# Patient Record
Sex: Female | Born: 1935 | Race: White | Hispanic: No
Health system: Southern US, Community
[De-identification: ages and names within clinical notes are randomized; demographics above are authoritative.]

---

## 1998-05-21 ENCOUNTER — Encounter: Admission: RE | Admit: 1998-05-21 | Discharge: 1998-05-22 | Payer: Self-pay | Admitting: Internal Medicine

## 1998-06-02 ENCOUNTER — Encounter: Payer: Self-pay | Admitting: Internal Medicine

## 1998-06-02 ENCOUNTER — Emergency Department (HOSPITAL_COMMUNITY): Admission: EM | Admit: 1998-06-02 | Discharge: 1998-06-02 | Payer: Self-pay | Admitting: Internal Medicine

## 1998-07-20 ENCOUNTER — Emergency Department (HOSPITAL_COMMUNITY): Admission: EM | Admit: 1998-07-20 | Discharge: 1998-07-20 | Payer: Self-pay | Admitting: Emergency Medicine

## 1998-07-23 ENCOUNTER — Ambulatory Visit: Admission: RE | Admit: 1998-07-23 | Discharge: 1998-07-23 | Payer: Self-pay | Admitting: Internal Medicine

## 1999-01-04 ENCOUNTER — Emergency Department (HOSPITAL_COMMUNITY): Admission: EM | Admit: 1999-01-04 | Discharge: 1999-01-04 | Payer: Self-pay | Admitting: Emergency Medicine

## 1999-01-04 ENCOUNTER — Encounter: Payer: Self-pay | Admitting: Emergency Medicine

## 1999-03-21 ENCOUNTER — Emergency Department (HOSPITAL_COMMUNITY): Admission: EM | Admit: 1999-03-21 | Discharge: 1999-03-21 | Payer: Self-pay

## 2000-08-25 ENCOUNTER — Emergency Department (HOSPITAL_COMMUNITY): Admission: EM | Admit: 2000-08-25 | Discharge: 2000-08-25 | Payer: Self-pay | Admitting: Emergency Medicine

## 2000-10-07 ENCOUNTER — Emergency Department (HOSPITAL_COMMUNITY): Admission: EM | Admit: 2000-10-07 | Discharge: 2000-10-07 | Payer: Self-pay | Admitting: Emergency Medicine

## 2002-04-24 ENCOUNTER — Encounter: Payer: Self-pay | Admitting: Emergency Medicine

## 2002-04-24 ENCOUNTER — Emergency Department (HOSPITAL_COMMUNITY): Admission: EM | Admit: 2002-04-24 | Discharge: 2002-04-24 | Payer: Self-pay | Admitting: Emergency Medicine

## 2002-11-14 ENCOUNTER — Inpatient Hospital Stay (HOSPITAL_COMMUNITY): Admission: EM | Admit: 2002-11-14 | Discharge: 2002-11-16 | Payer: Self-pay | Admitting: Emergency Medicine

## 2002-11-18 ENCOUNTER — Emergency Department (HOSPITAL_COMMUNITY): Admission: EM | Admit: 2002-11-18 | Discharge: 2002-11-18 | Payer: Self-pay | Admitting: Emergency Medicine

## 2002-12-01 ENCOUNTER — Inpatient Hospital Stay (HOSPITAL_COMMUNITY): Admission: EM | Admit: 2002-12-01 | Discharge: 2002-12-04 | Payer: Self-pay | Admitting: Emergency Medicine

## 2004-02-22 IMAGING — CT CT HEAD W/O CM
1 of 2 series · 13 of 30 positions shown, 17 images · non-contrast
Comparison: none

CLINICAL DATA: Fell striking forehead.  Laceration.

[Series 3: — · axial · 0.43mm/px · z∈[-173,-53]mm · 13 of 30 slices shown, 17 images]
[im 3/30  brain]
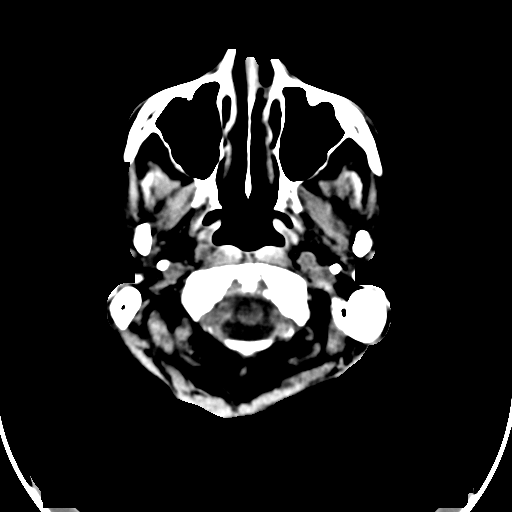
[im 3/30  bone]
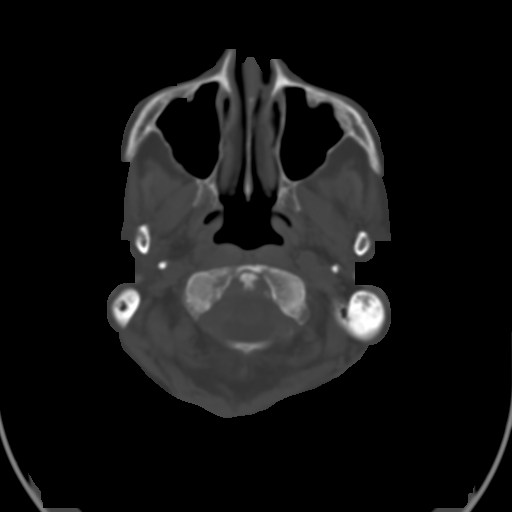
[im 5/30  brain]
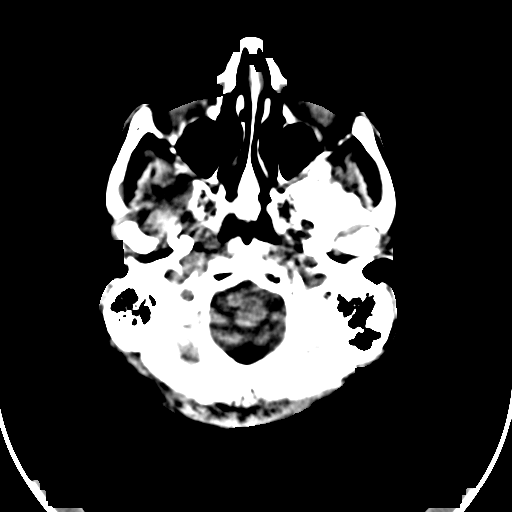
[im 7/30  brain]
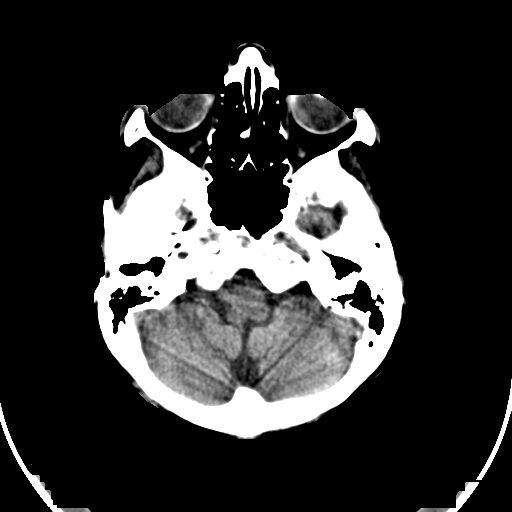
[im 9/30  brain]
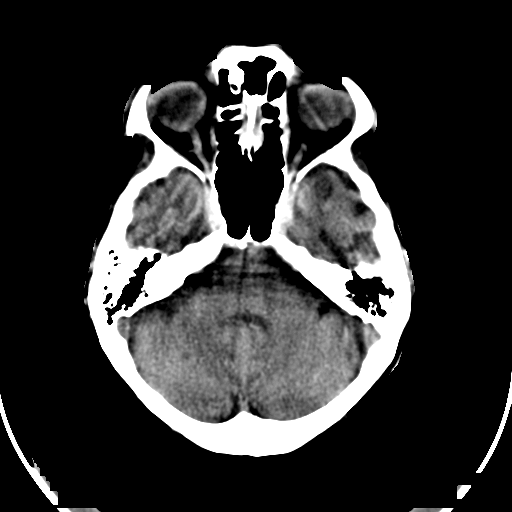
[im 11/30  brain]
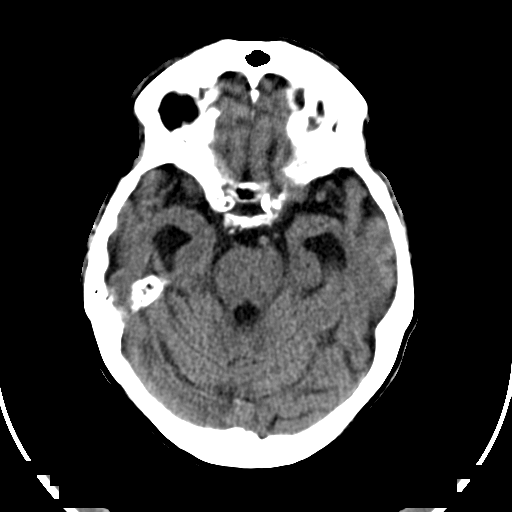
[im 11/30  bone]
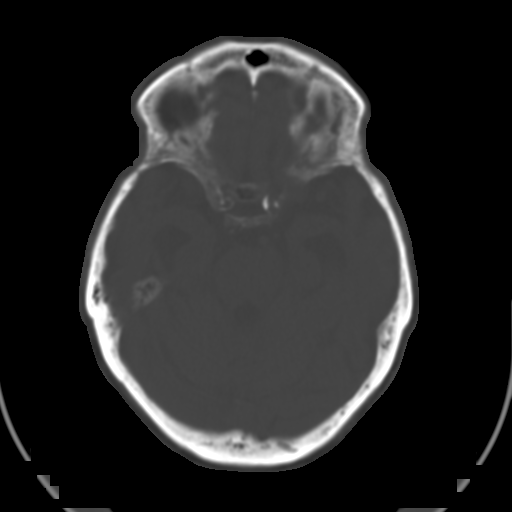
[im 13/30  brain]
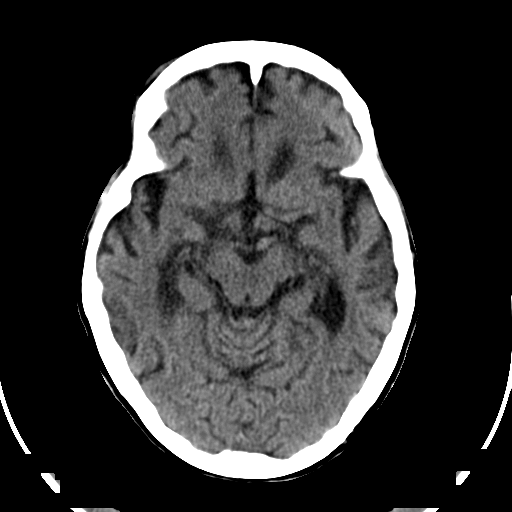
[im 15/30  brain]
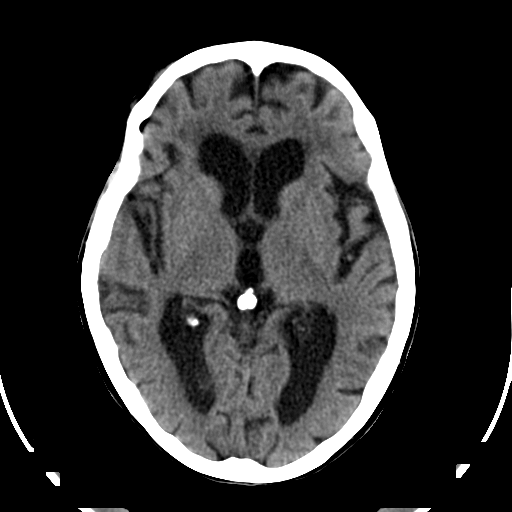
[im 17/30  brain]
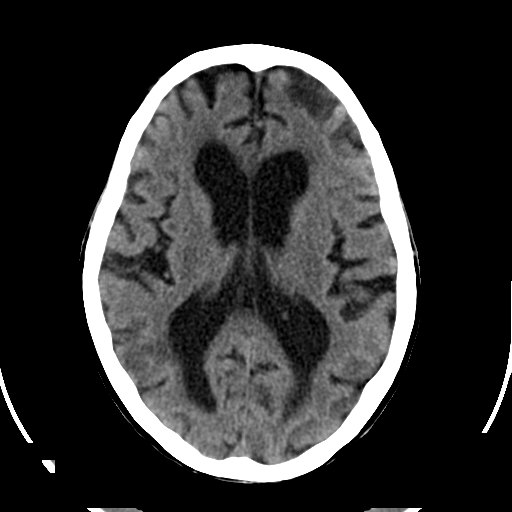
[im 19/30  brain]
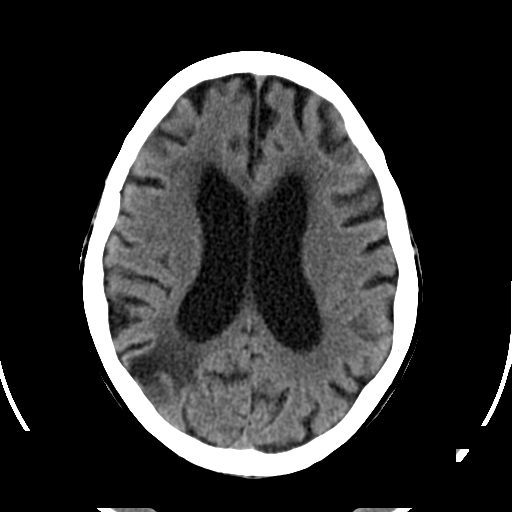
[im 19/30  bone]
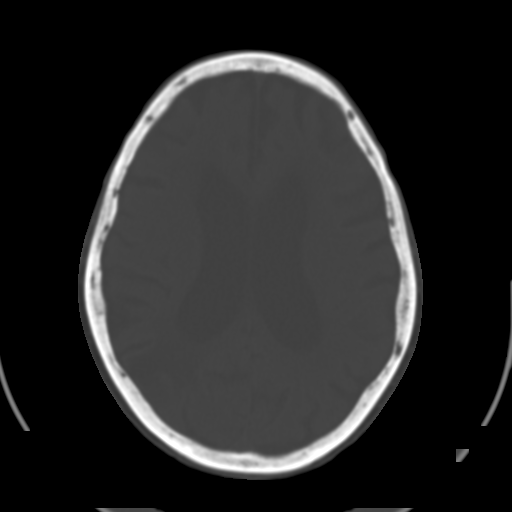
[im 21/30  brain]
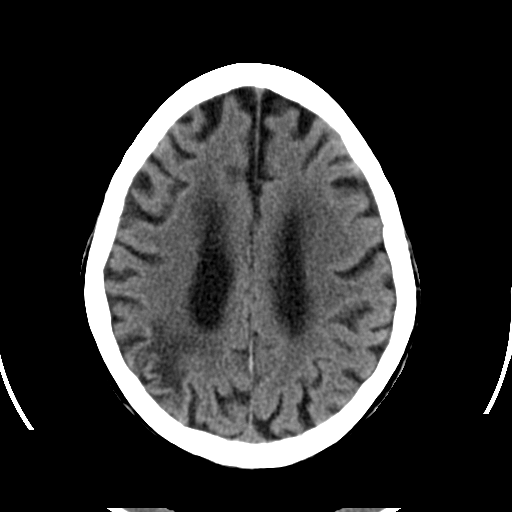
[im 23/30  brain]
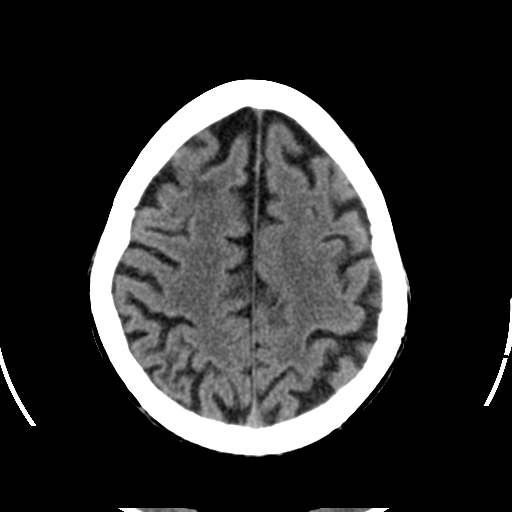
[im 25/30  brain]
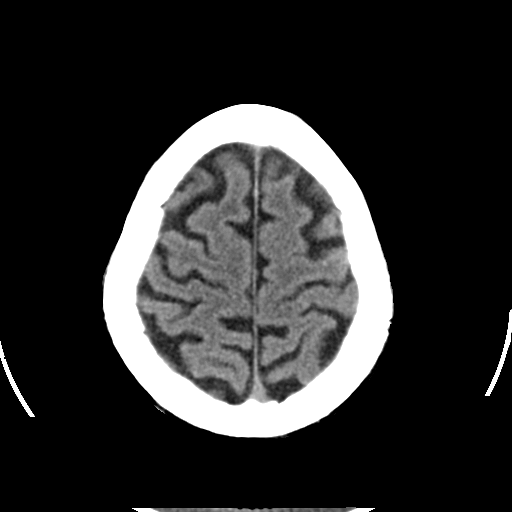
[im 27/30  brain]
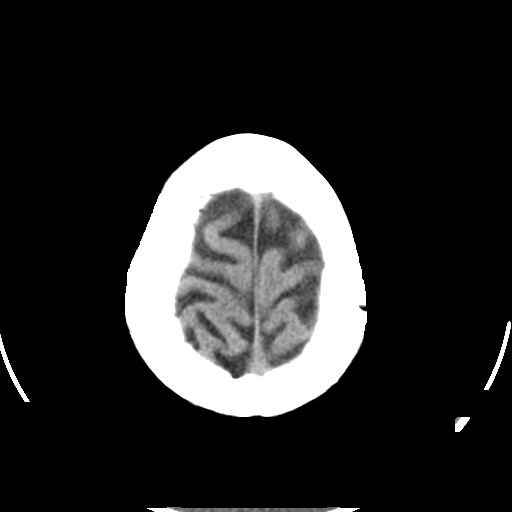
[im 27/30  bone]
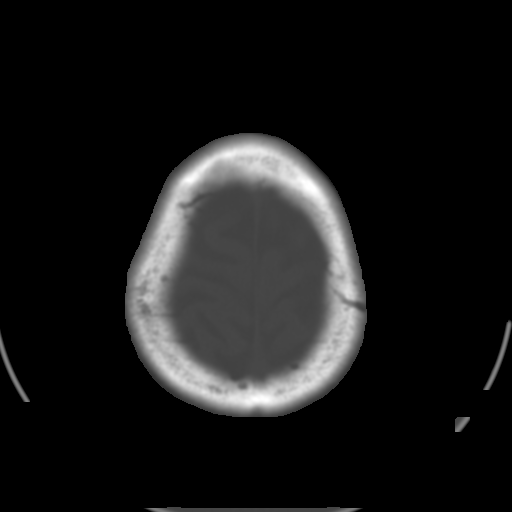

[13 of 30 positions shown; findings below may reference images not displayed]

CT HEAD WITHOUT CONTRAST
 Routine noncontrast CT head compared to 04/24/02.

 Generalized atrophy.  Mild small vessel chronic ischemic changes of the cerebral white matter.  Old right posterior parietal infarct.  No hydrocephalus, midline shift, or mass effect.  No acute hemorrhage, infarct or mass.  Sinuses are clear.  Bones are unremarkable.

 IMPRESSION
 Atrophy with small vessel chronic ischemic changes of the deep cerebral white matter.

 Old right parietal infarct.

 No acute intracranial abnormalities.

## 2021-01-06 ENCOUNTER — Telehealth: Payer: Self-pay | Admitting: Nurse Practitioner

## 2021-01-06 NOTE — Telephone Encounter (Signed)
Erroneous encounter
